# Patient Record
Sex: Male | Born: 1970
Health system: Southern US, Community
[De-identification: ages and names within clinical notes are randomized; demographics above are authoritative.]

## PROBLEM LIST (undated history)

## (undated) DIAGNOSIS — N189 Chronic kidney disease, unspecified: Secondary | ICD-10-CM

## (undated) HISTORY — PX: LITHOTRIPSY: SUR834

---

## 2007-09-15 ENCOUNTER — Emergency Department: Payer: Self-pay | Admitting: Emergency Medicine

## 2012-02-16 ENCOUNTER — Emergency Department: Payer: Self-pay | Admitting: Emergency Medicine

## 2012-02-16 LAB — COMPREHENSIVE METABOLIC PANEL
Alkaline Phosphatase: 31 U/L — ABNORMAL LOW (ref 50–136)
BUN: 11 mg/dL (ref 7–18)
Calcium, Total: 8.3 mg/dL — ABNORMAL LOW (ref 8.5–10.1)
Chloride: 99 mmol/L (ref 98–107)
Co2: 28 mmol/L (ref 21–32)
Creatinine: 1.46 mg/dL — ABNORMAL HIGH (ref 0.60–1.30)
EGFR (Non-African Amer.): 57 — ABNORMAL LOW
Glucose: 127 mg/dL — ABNORMAL HIGH (ref 65–99)
Osmolality: 279 (ref 275–301)
SGOT(AST): 44 U/L — ABNORMAL HIGH (ref 15–37)
SGPT (ALT): 44 U/L
Sodium: 139 mmol/L (ref 136–145)
Total Protein: 7 g/dL (ref 6.4–8.2)

## 2012-02-16 LAB — URINALYSIS, COMPLETE
Bacteria: NONE SEEN
Glucose,UR: NEGATIVE mg/dL (ref 0–75)
Nitrite: NEGATIVE
Protein: NEGATIVE
RBC,UR: 1 /HPF (ref 0–5)
WBC UR: 2 /HPF (ref 0–5)

## 2012-02-16 LAB — CBC WITH DIFFERENTIAL/PLATELET
Basophil %: 0.2 %
HGB: 15.1 g/dL (ref 13.0–18.0)
Lymphocyte #: 0.2 10*3/uL — ABNORMAL LOW (ref 1.0–3.6)
Lymphocyte %: 3.3 %
MCHC: 32.7 g/dL (ref 32.0–36.0)
MCV: 86 fL (ref 80–100)
Monocyte #: 0.7 10*3/uL (ref 0.0–0.7)
Neutrophil %: 87.7 %
RBC: 5.37 10*6/uL (ref 4.40–5.90)

## 2012-02-16 LAB — MONONUCLEOSIS SCREEN: Mono Test: NEGATIVE

## 2012-02-16 LAB — DRUG SCREEN, URINE
Barbiturates, Ur Screen: NEGATIVE (ref ?–200)
Benzodiazepine, Ur Scrn: NEGATIVE (ref ?–200)
Cannabinoid 50 Ng, Ur ~~LOC~~: NEGATIVE (ref ?–50)
Cocaine Metabolite,Ur ~~LOC~~: NEGATIVE (ref ?–300)
Methadone, Ur Screen: NEGATIVE (ref ?–300)
Tricyclic, Ur Screen: NEGATIVE (ref ?–1000)

## 2012-02-21 LAB — CULTURE, BLOOD (SINGLE)

## 2012-12-14 HISTORY — PX: COLONOSCOPY: SHX174

## 2014-06-13 ENCOUNTER — Emergency Department: Payer: Self-pay | Admitting: Emergency Medicine

## 2014-06-13 LAB — CBC WITH DIFFERENTIAL/PLATELET
BASOS ABS: 0.1 10*3/uL (ref 0.0–0.1)
Basophil %: 0.9 %
EOS ABS: 0.1 10*3/uL (ref 0.0–0.7)
EOS PCT: 1.5 %
HCT: 45.6 % (ref 40.0–52.0)
HGB: 14.9 g/dL (ref 13.0–18.0)
LYMPHS ABS: 1.7 10*3/uL (ref 1.0–3.6)
LYMPHS PCT: 21.2 %
MCH: 28.9 pg (ref 26.0–34.0)
MCHC: 32.7 g/dL (ref 32.0–36.0)
MCV: 88 fL (ref 80–100)
MONO ABS: 0.7 x10 3/mm (ref 0.2–1.0)
Monocyte %: 8.4 %
NEUTROS ABS: 5.3 10*3/uL (ref 1.4–6.5)
Neutrophil %: 68 %
PLATELETS: 182 10*3/uL (ref 150–440)
RBC: 5.16 10*6/uL (ref 4.40–5.90)
RDW: 13.3 % (ref 11.5–14.5)
WBC: 7.9 10*3/uL (ref 3.8–10.6)

## 2014-06-13 LAB — LIPASE, BLOOD: Lipase: 120 U/L (ref 73–393)

## 2014-06-13 LAB — URINALYSIS, COMPLETE
Bacteria: NONE SEEN
Bilirubin,UR: NEGATIVE
Glucose,UR: NEGATIVE mg/dL (ref 0–75)
KETONE: NEGATIVE
Leukocyte Esterase: NEGATIVE
NITRITE: NEGATIVE
PH: 7 (ref 4.5–8.0)
PROTEIN: NEGATIVE
RBC,UR: 60 /HPF (ref 0–5)
SQUAMOUS EPITHELIAL: NONE SEEN
Specific Gravity: 1.017 (ref 1.003–1.030)

## 2014-06-13 LAB — COMPREHENSIVE METABOLIC PANEL
ALT: 57 U/L (ref 12–78)
ANION GAP: 6 — AB (ref 7–16)
AST: 86 U/L — AB (ref 15–37)
Albumin: 4 g/dL (ref 3.4–5.0)
Alkaline Phosphatase: 65 U/L
BUN: 11 mg/dL (ref 7–18)
Bilirubin,Total: 0.4 mg/dL (ref 0.2–1.0)
CREATININE: 1.53 mg/dL — AB (ref 0.60–1.30)
Calcium, Total: 8.9 mg/dL (ref 8.5–10.1)
Chloride: 101 mmol/L (ref 98–107)
Co2: 30 mmol/L (ref 21–32)
EGFR (Non-African Amer.): 56 — ABNORMAL LOW
Glucose: 96 mg/dL (ref 65–99)
OSMOLALITY: 273 (ref 275–301)
Potassium: 3.8 mmol/L (ref 3.5–5.1)
SODIUM: 137 mmol/L (ref 136–145)
TOTAL PROTEIN: 7.9 g/dL (ref 6.4–8.2)

## 2014-06-13 LAB — TROPONIN I: Troponin-I: <0.02 ng/mL

## 2014-06-14 ENCOUNTER — Emergency Department: Payer: Self-pay | Admitting: Emergency Medicine

## 2014-06-19 ENCOUNTER — Other Ambulatory Visit: Payer: Self-pay | Admitting: Urology

## 2014-06-25 ENCOUNTER — Encounter (HOSPITAL_COMMUNITY): Payer: Self-pay | Admitting: Pharmacy Technician

## 2014-07-04 NOTE — Progress Notes (Signed)
Patient returned call and stated he passed the stone and was cancelling the litho. Instructed him to call  Dr Herrick's office

## 2014-07-05 ENCOUNTER — Ambulatory Visit (HOSPITAL_COMMUNITY): Admission: RE | Admit: 2014-07-05 | Payer: 59 | Source: Ambulatory Visit | Admitting: Urology

## 2014-07-05 ENCOUNTER — Encounter (HOSPITAL_COMMUNITY): Admission: RE | Payer: Self-pay | Source: Ambulatory Visit

## 2014-07-05 SURGERY — LITHOTRIPSY, ESWL
Anesthesia: LOCAL | Laterality: Right

## 2014-08-07 ENCOUNTER — Encounter (HOSPITAL_COMMUNITY): Payer: Self-pay | Admitting: Pharmacy Technician

## 2014-08-08 ENCOUNTER — Encounter (HOSPITAL_COMMUNITY): Payer: Self-pay | Admitting: *Deleted

## 2014-08-09 ENCOUNTER — Ambulatory Visit (HOSPITAL_COMMUNITY)
Admission: RE | Admit: 2014-08-09 | Discharge: 2014-08-09 | Disposition: A | Discharge status: Home / Self Care | Payer: 59 | Source: Ambulatory Visit | Attending: Urology | Admitting: Urology

## 2014-08-09 ENCOUNTER — Encounter (HOSPITAL_COMMUNITY): Payer: Self-pay | Admitting: *Deleted

## 2014-08-09 ENCOUNTER — Ambulatory Visit (HOSPITAL_COMMUNITY): Payer: 59

## 2014-08-09 ENCOUNTER — Encounter (HOSPITAL_COMMUNITY)
Admission: RE | Disposition: A | Discharge status: Home / Self Care | Payer: Self-pay | Source: Ambulatory Visit | Attending: Urology

## 2014-08-09 DIAGNOSIS — Z79899 Other long term (current) drug therapy: Secondary | ICD-10-CM | POA: Insufficient documentation

## 2014-08-09 DIAGNOSIS — N201 Calculus of ureter: Secondary | ICD-10-CM | POA: Insufficient documentation

## 2014-08-09 DIAGNOSIS — N200 Calculus of kidney: Secondary | ICD-10-CM

## 2014-08-09 HISTORY — DX: Chronic kidney disease, unspecified: N18.9

## 2014-08-09 IMAGING — CR DG ABDOMEN 1V
1 series · 1 of 1 positions shown · non-contrast
Comparison: CT abdomen and pelvis [DATE]

CLINICAL DATA: Preoperative lithotripsy

EXAM:
ABDOMEN - 1 VIEW

[t abdomen supine]
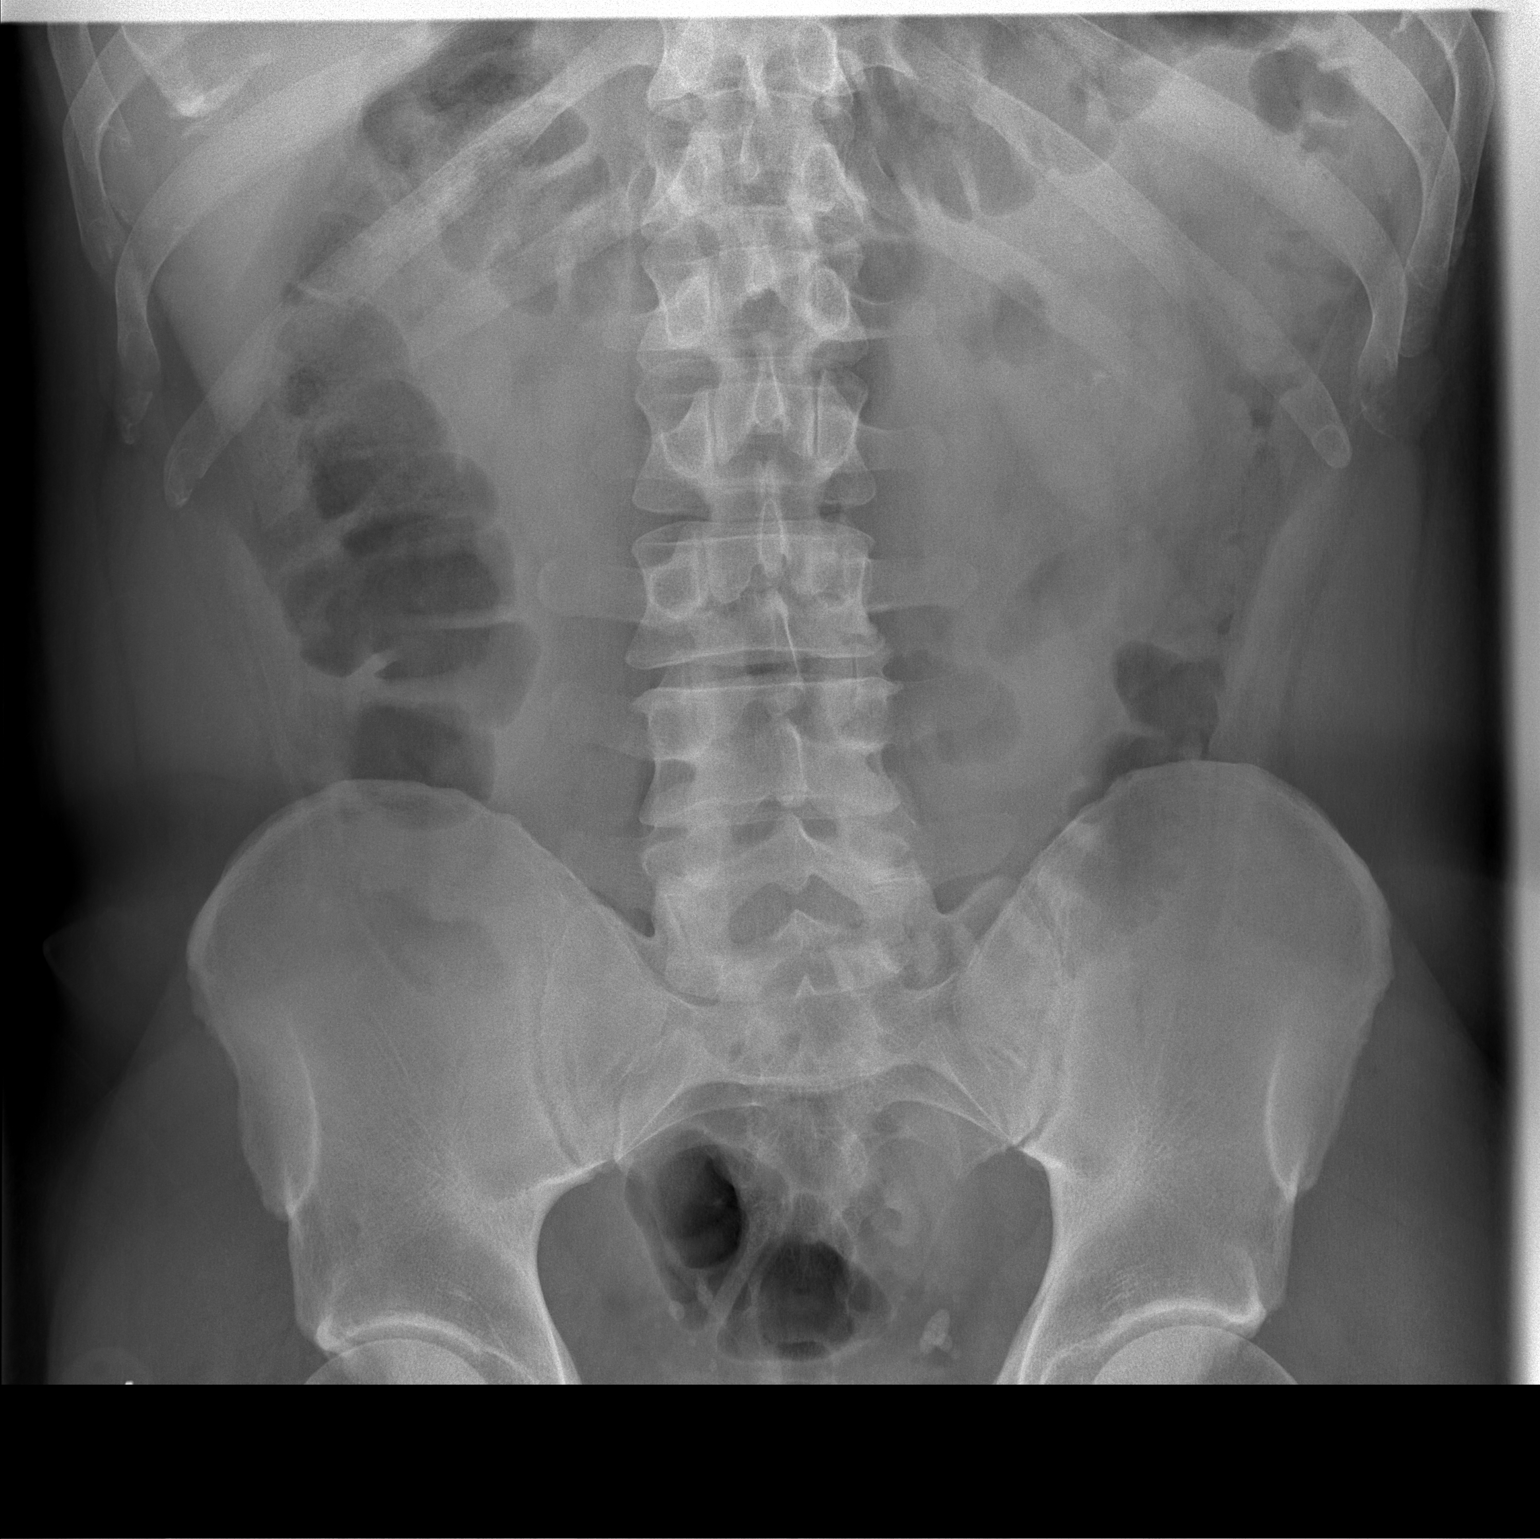

[1 of 1 positions shown; findings below may reference images not displayed]

FINDINGS: There is a 5 mm calcification in the mid right pelvis which
corresponds to a distal ureteral calculus seen on recent CT. There
are nearby phleboliths on the right. There also apparent phleboliths
in the left pelvis. There is a the 4 mm calculus in the lower pole
left kidney as well as a 2 mm calculus in the mid left kidney. Bowel
gas pattern is unremarkable. No obstruction or free air.
IMPRESSION: Small intrarenal calculi on the left. Probable distal ureteral
calculus in the mid right pelvis measuring 5 mm. There are several
phleboliths in the pelvis bilaterally. Bowel gas pattern
unremarkable.

## 2014-08-09 SURGERY — LITHOTRIPSY, ESWL
Anesthesia: LOCAL | Laterality: Right

## 2014-08-09 MED ORDER — DIPHENHYDRAMINE HCL 25 MG PO CAPS
25.0000 mg | ORAL_CAPSULE | ORAL | Status: AC
  Administered 2014-08-09: 25 mg via ORAL
  Filled 2014-08-09: qty 1

## 2014-08-09 MED ORDER — CIPROFLOXACIN HCL 500 MG PO TABS
500.0000 mg | ORAL_TABLET | ORAL | Status: AC
  Administered 2014-08-09: 500 mg via ORAL
  Filled 2014-08-09: qty 1

## 2014-08-09 MED ORDER — SODIUM CHLORIDE 0.9 % IV SOLN
INTRAVENOUS | Status: DC
  Administered 2014-08-09: 15:00:00 via INTRAVENOUS

## 2014-08-09 MED ORDER — DIAZEPAM 5 MG PO TABS
10.0000 mg | ORAL_TABLET | ORAL | Status: AC
  Administered 2014-08-09: 10 mg via ORAL
  Filled 2014-08-09: qty 2

## 2014-08-09 NOTE — Discharge Instructions (Signed)
See [facility] discharge instructions in chart.  

## 2014-08-09 NOTE — Op Note (Signed)
See [facility] OP note scanned into chart. 

## 2014-08-09 NOTE — H&P (Signed)
Reason For Visit Renal colic   History of Present Illness This 43 year old male returns to clinic today after 3 days of intermittent renal colic with associated frequency and urgency. In addition, the patient has been passing blood clots. He initially presented to me possibly one month ago with a 5 x 10 mm mid ureteral stone on the right side. He is scheduled to undergo shockwave lithotripsy, but canceled because he thought that he had passed a stone and was no longer symptomatic. However over the past several days his symptoms have returned. The patient denies any fever/chills. He denies any dysuria.   Interval: Since the patient's last appointment he has had 3 separate episodes of renal colic with associated increased frequency and urgency. The second episode was associated with hematuria. The third episode was associated with extreme flank pain. The patient denies any fevers or chills. He states that he has had intermittent dysuria.   Past Medical History Problems  1. History of Anxiety (300.00)  Surgical History Problems  1. History of No Surgical Problems  Current Meds 1. OxyCODONE HCl - 5 MG Oral Capsule; TAKE 1 TO 2 CAPSULES EVERY 4 HOURS AS  NEEDED FOR BREAKTHROUGH PAIN;  Therapy: 07Jul2015 to (Evaluate:11Aug2015); Last Rx:06Aug2015 Ordered 2. Percocet TABS;  Therapy: (Recorded:07Jul2015) to Recorded 3. Tamsulosin HCl - 0.4 MG Oral Capsule;  Therapy: (Recorded:07Jul2015) to Recorded  Allergies Medication  1. No Known Drug Allergies  Family History Problems  1. Family history of kidney stones (V18.69) : Father  Social History Problems  1. Denied: History of Alcohol use 2. Caffeine use (V49.89) 3. Five children 4. Married 5. Never a smoker 6. Occupation   Production Management  Vitals Vital Signs [Data Includes: Last 1 Day]  Recorded: 24Aug2015 03:28PM  Blood Pressure: 127 / 71 Temperature: 97.7 F Heart Rate: 75  Physical Exam Constitutional: Well nourished  and well developed . No acute distress.  Pulmonary: No respiratory distress and normal respiratory rhythm and effort.  Cardiovascular: Heart rate and rhythm are normal . No peripheral edema.  Neuro/Psych:. Mood and affect are appropriate.    Results/Data Urine [Data Includes: Last 1 Day]   24Aug2015  COLOR YELLOW   APPEARANCE CLEAR   SPECIFIC GRAVITY 1.020   pH 6.0   GLUCOSE NEG mg/dL  BILIRUBIN NEG   KETONE NEG mg/dL  BLOOD MOD   PROTEIN NEG mg/dL  UROBILINOGEN 0.2 mg/dL  NITRITE NEG   LEUKOCYTE ESTERASE NEG   SQUAMOUS EPITHELIAL/HPF NONE SEEN   WBC NONE SEEN WBC/hpf  RBC 3-6 RBC/hpf  BACTERIA NONE SEEN   CRYSTALS NONE SEEN   CASTS NONE SEEN   Selected Results  AU CT-STONE PROTOCOL 24Aug2015 12:00AM Berniece Salines   Test Name Result Flag Reference  CT-STONE PROTOCOL (Report)    ** RADIOLOGY REPORT BY [facility] **   CLINICAL DATA: Micro hematuria.  EXAM: CT ABDOMEN AND PELVIS WITHOUT CONTRAST (URINARY CALCULUS PROTOCOL)  TECHNIQUE: Multidetector CT imaging was performed through the abdomen and pelvis without intravenous contrast to include the urinary tract.  COMPARISON: 06/14/2014  FINDINGS: Lung bases are clear. No effusions. Heart is normal size.  Liver, spleen, pancreas, adrenals have an unremarkable unenhanced appearance. Gallbladder is contracted.  Previously seen right ureter has migrated into the distal right ureter a few cm from the UVJ. This measures 6 mm in largest cross-sectional diameter. Punctate nonobstructing stone in the lower pole of the right kidney. Small nonobstructing stones in the mid to lower poles of the left kidney. Mild right hydronephrosis,  slightly improved since prior study.  Stomach, large and small bowel are unremarkable. Normal appendix. Aorta is normal caliber. No free fluid, free air or adenopathy.  No acute bony abnormality or focal bone lesion.  IMPRESSION: Previously seen proximal right ureteral  stone is now in the distal right ureter a few cm from the right UVJ. This measures 6 mm in cross-sectional diameter.  Bilateral nephrolithiasis.  Mild right hydronephrosis, slightly improved.   Electronically Signed  By: Charlett Nose M.D.  On: 08/06/2014 16:45    The patient's urinalysis today reveals microscopic hematuria without evidence of infection or inflammation. A urine culture has been sent.  Assessment The patient has a persistent right distal ureteral stone with associated proximal hydronephrosis.   Plan Health Maintenance  1. UA With REFLEX; [Do Not Release]; Status:Complete;   Done: 24Aug2015 03:01PM Right ureteral calculus  2. AU CT-STONE PROTOCOL; Status:Complete;   Done: 24Aug2015 12:00AM  Discussion/Summary Patient has been on medical expulsion therapy over the past 5 or 6 weeks. He continues to have intermittent episodes of pain. He is ready to have the stone treated. As such, we discussed treatment options. Predominantly we discussed ureteroscopy and shockwave lithotripsy. He is not interested in ureteroscopy. Given that the stone is visible with a scout film from the CT scan in the right pelvis over the sacral region is reasonable to attempt shockwave lithotripsy. I did counsel him that this may be difficult to see and as a result if I could not targeted safely I would not perform the procedure. The patient has agreed to proceed with the operation.

## 2016-08-06 ENCOUNTER — Encounter
Admission: RE | Admit: 2016-08-06 | Discharge: 2016-08-06 | Disposition: A | Discharge status: Home / Self Care | Payer: 59 | Source: Ambulatory Visit | Attending: Surgery | Admitting: Surgery

## 2016-08-13 NOTE — Patient Instructions (Signed)
  Your procedure is scheduled on: 08-14-16 Report to Same Day Surgery 2nd floor medical mall To find out your arrival time please call [phone] between 1PM - 3PM on 08-13-16  Remember: Instructions that are not followed completely may result in serious medical risk, up to and including death, or upon the discretion of your surgeon and anesthesiologist your surgery may need to be rescheduled.    _x___ 1. Do not eat food or drink liquids after midnight. No gum chewing or hard candies.     __x__ 2. No Alcohol for 24 hours before or after surgery.   __x__3. No Smoking for 24 prior to surgery.   ____  4. Bring all medications with you on the day of surgery if instructed.    __x__ 5. Notify your doctor if there is any change in your medical condition     (cold, fever, infections).     Do not wear jewelry, make-up, hairpins, clips or nail polish.  Do not wear lotions, powders, or perfumes. You may wear deodorant.  Do not shave 48 hours prior to surgery. Men may shave face and neck.  Do not bring valuables to the hospital.    [facility] is not responsible for any belongings or valuables.               Contacts, dentures or bridgework may not be worn into surgery.  Leave your suitcase in the car. After surgery it may be brought to your room.  For patients admitted to the hospital, discharge time is determined by your treatment team.   Patients discharged the day of surgery will not be allowed to drive home.    Please read over the following fact sheets that you were given:     ____ Take these medicines the morning of surgery with A SIP OF WATER:    1. NONE  2.  3.  4.  5.  6.  ____ Fleet Enema (as directed)   ____ Use CHG Soap or sage wipes as directed on instruction sheet   ____ Use inhalers on the day of surgery and bring to hospital day of surgery  ____ Stop metformin 2 days prior to surgery    ____ Take 1/2 of usual insulin dose the night before surgery and none on the  morning of           surgery.   ____ Stop aspirin or coumadin, or plavix  _x__ Stop Anti-inflammatories such as Advil, Aleve, Ibuprofen, Motrin, Naproxen,          Naprosyn, Goodies powders or aspirin products. Ok to take Tylenol.   ____ Stop supplements until after surgery.    ____ Bring C-Pap to the hospital.

## 2016-08-14 ENCOUNTER — Encounter
Admission: RE | Disposition: A | Discharge status: Home / Self Care | Payer: Self-pay | Source: Ambulatory Visit | Attending: Surgery

## 2016-08-14 ENCOUNTER — Ambulatory Visit
Admission: RE | Admit: 2016-08-14 | Discharge: 2016-08-14 | Disposition: A | Discharge status: Home / Self Care | Payer: 59 | Source: Ambulatory Visit | Attending: Surgery | Admitting: Surgery

## 2016-08-14 ENCOUNTER — Ambulatory Visit: Payer: 59 | Admitting: Anesthesiology

## 2016-08-14 DIAGNOSIS — E669 Obesity, unspecified: Secondary | ICD-10-CM | POA: Diagnosis not present

## 2016-08-14 DIAGNOSIS — Z833 Family history of diabetes mellitus: Secondary | ICD-10-CM | POA: Diagnosis not present

## 2016-08-14 DIAGNOSIS — Z8249 Family history of ischemic heart disease and other diseases of the circulatory system: Secondary | ICD-10-CM | POA: Insufficient documentation

## 2016-08-14 DIAGNOSIS — Z79899 Other long term (current) drug therapy: Secondary | ICD-10-CM | POA: Diagnosis not present

## 2016-08-14 DIAGNOSIS — Z87442 Personal history of urinary calculi: Secondary | ICD-10-CM | POA: Insufficient documentation

## 2016-08-14 DIAGNOSIS — Z6834 Body mass index (BMI) 34.0-34.9, adult: Secondary | ICD-10-CM | POA: Insufficient documentation

## 2016-08-14 DIAGNOSIS — K439 Ventral hernia without obstruction or gangrene: Secondary | ICD-10-CM | POA: Diagnosis not present

## 2016-08-14 HISTORY — PX: VENTRAL HERNIA REPAIR: SHX424

## 2016-08-14 SURGERY — REPAIR, HERNIA, VENTRAL
Anesthesia: General

## 2016-08-14 MED ORDER — SODIUM CHLORIDE FLUSH 0.9 % IV SOLN
INTRAVENOUS | Status: AC
  Filled 2016-08-14: qty 10

## 2016-08-14 MED ORDER — FENTANYL CITRATE (PF) 100 MCG/2ML IJ SOLN
INTRAMUSCULAR | Status: AC
  Administered 2016-08-14: 25 ug via INTRAVENOUS
  Filled 2016-08-14: qty 2

## 2016-08-14 MED ORDER — CEFAZOLIN SODIUM-DEXTROSE 2-4 GM/100ML-% IV SOLN
INTRAVENOUS | Status: AC
  Administered 2016-08-14: 2 g via INTRAVENOUS
  Filled 2016-08-14: qty 100

## 2016-08-14 MED ORDER — FAMOTIDINE 20 MG PO TABS
ORAL_TABLET | ORAL | Status: AC
  Administered 2016-08-14: 20 mg via ORAL
  Filled 2016-08-14: qty 1

## 2016-08-14 MED ORDER — FAMOTIDINE 20 MG PO TABS
20.0000 mg | ORAL_TABLET | Freq: Once | ORAL | Status: AC
  Administered 2016-08-14: 20 mg via ORAL

## 2016-08-14 MED ORDER — HYDROCODONE-ACETAMINOPHEN 5-325 MG PO TABS: 1.0000 | ORAL_TABLET | ORAL | 0 refills | Status: DC | PRN

## 2016-08-14 MED ORDER — OXYCODONE HCL 5 MG PO TABS: 5.0000 mg | ORAL_TABLET | Freq: Once | ORAL | Status: DC | PRN

## 2016-08-14 MED ORDER — NEOSTIGMINE METHYLSULFATE 10 MG/10ML IV SOLN
INTRAVENOUS | Status: DC | PRN
  Administered 2016-08-14: 3 mg via INTRAVENOUS

## 2016-08-14 MED ORDER — PROMETHAZINE HCL 25 MG/ML IJ SOLN: 6.2500 mg | INTRAMUSCULAR | Status: DC | PRN

## 2016-08-14 MED ORDER — BUPIVACAINE-EPINEPHRINE (PF) 0.5% -1:200000 IJ SOLN
INTRAMUSCULAR | Status: AC
  Filled 2016-08-14: qty 30

## 2016-08-14 MED ORDER — CEFAZOLIN SODIUM-DEXTROSE 2-4 GM/100ML-% IV SOLN
2.0000 g | Freq: Once | INTRAVENOUS | Status: AC
  Administered 2016-08-14: 2 g via INTRAVENOUS

## 2016-08-14 MED ORDER — MEPERIDINE HCL 25 MG/ML IJ SOLN: 6.2500 mg | INTRAMUSCULAR | Status: DC | PRN

## 2016-08-14 MED ORDER — FENTANYL CITRATE (PF) 100 MCG/2ML IJ SOLN
INTRAMUSCULAR | Status: DC | PRN
  Administered 2016-08-14 (×2): 50 ug via INTRAVENOUS

## 2016-08-14 MED ORDER — PROPOFOL 10 MG/ML IV BOLUS
INTRAVENOUS | Status: DC | PRN
  Administered 2016-08-14: 200 mg via INTRAVENOUS

## 2016-08-14 MED ORDER — OXYCODONE HCL 5 MG/5ML PO SOLN: 5.0000 mg | Freq: Once | ORAL | Status: DC | PRN

## 2016-08-14 MED ORDER — FENTANYL CITRATE (PF) 100 MCG/2ML IJ SOLN
25.0000 ug | INTRAMUSCULAR | Status: DC | PRN
  Administered 2016-08-14 (×4): 25 ug via INTRAVENOUS

## 2016-08-14 MED ORDER — ROCURONIUM BROMIDE 100 MG/10ML IV SOLN
INTRAVENOUS | Status: DC | PRN
  Administered 2016-08-14: 50 mg via INTRAVENOUS

## 2016-08-14 MED ORDER — BUPIVACAINE-EPINEPHRINE 0.5% -1:200000 IJ SOLN
INTRAMUSCULAR | Status: DC | PRN
  Administered 2016-08-14: 10 mL

## 2016-08-14 MED ORDER — LACTATED RINGERS IV SOLN
INTRAVENOUS | Status: DC
  Administered 2016-08-14: 06:00:00 via INTRAVENOUS

## 2016-08-14 MED ORDER — HYDROCODONE-ACETAMINOPHEN 5-325 MG PO TABS: 1.0000 | ORAL_TABLET | ORAL | Status: DC | PRN

## 2016-08-14 MED ORDER — DEXAMETHASONE SODIUM PHOSPHATE 10 MG/ML IJ SOLN
INTRAMUSCULAR | Status: DC | PRN
  Administered 2016-08-14: 10 mg via INTRAVENOUS

## 2016-08-14 MED ORDER — ONDANSETRON HCL 4 MG/2ML IJ SOLN
INTRAMUSCULAR | Status: DC | PRN
  Administered 2016-08-14: 4 mg via INTRAVENOUS

## 2016-08-14 MED ORDER — MIDAZOLAM HCL 2 MG/2ML IJ SOLN
INTRAMUSCULAR | Status: DC | PRN
  Administered 2016-08-14: 2 mg via INTRAVENOUS

## 2016-08-14 MED ORDER — GLYCOPYRROLATE 0.2 MG/ML IJ SOLN
INTRAMUSCULAR | Status: DC | PRN
Start: 2016-08-14 — End: 2016-08-14
  Administered 2016-08-14: 0.4 mg via INTRAVENOUS

## 2016-08-14 MED ORDER — LIDOCAINE HCL (CARDIAC) 20 MG/ML IV SOLN
INTRAVENOUS | Status: DC | PRN
  Administered 2016-08-14: 100 mg via INTRAVENOUS

## 2016-08-14 SURGICAL SUPPLY — 24 items
CANISTER SUCT 1200ML W/VALVE (MISCELLANEOUS) ×3 IMPLANT
CHLORAPREP W/TINT 26ML (MISCELLANEOUS) ×3 IMPLANT
DRAPE LAPAROTOMY 100X77 ABD (DRAPES) ×3 IMPLANT
ELECT REM PT RETURN 9FT ADLT (ELECTROSURGICAL) ×3
ELECTRODE REM PT RTRN 9FT ADLT (ELECTROSURGICAL) ×1 IMPLANT
GAUZE SPONGE 4X4 12PLY STRL (GAUZE/BANDAGES/DRESSINGS) IMPLANT
GLOVE BIO SURGEON STRL SZ7.5 (GLOVE) ×15 IMPLANT
GOWN STRL REUS W/ TWL LRG LVL3 (GOWN DISPOSABLE) ×3 IMPLANT
GOWN STRL REUS W/TWL LRG LVL3 (GOWN DISPOSABLE) ×6
KIT RM TURNOVER STRD PROC AR (KITS) ×3 IMPLANT
LABEL OR SOLS (LABEL) ×3 IMPLANT
LIQUID BAND (GAUZE/BANDAGES/DRESSINGS) ×3 IMPLANT
MESH SYNTHETIC 4X6 SOFT BARD (Mesh General) ×1 IMPLANT
MESH SYNTHETIC SOFT BARD 4X6 (Mesh General) ×2 IMPLANT
NEEDLE HYPO 25X1 1.5 SAFETY (NEEDLE) ×3 IMPLANT
NS IRRIG 500ML POUR BTL (IV SOLUTION) ×3 IMPLANT
PACK BASIN MINOR ARMC (MISCELLANEOUS) ×3 IMPLANT
STAPLER SKIN PROX 35W (STAPLE) IMPLANT
SUT CHROMIC 3 0 SH 27 (SUTURE) ×3 IMPLANT
SUT MNCRL 4-0 (SUTURE) ×2
SUT MNCRL 4-0 27XMFL (SUTURE) ×1
SUT SURGILON 0 30 BLK (SUTURE) ×6 IMPLANT
SUTURE MNCRL 4-0 27XMF (SUTURE) ×1 IMPLANT
SYRINGE 10CC LL (SYRINGE) ×3 IMPLANT

## 2016-08-14 NOTE — H&P (Signed)
  He reports no change in condition since office exam.  On exam demonstrated site of ventral hernia, now reduced.  Labs noted.  Discussed plan for ventral hernia repair

## 2016-08-14 NOTE — Anesthesia Postprocedure Evaluation (Signed)
Anesthesia Post Note  Patient: Nathan Valdez  Procedure(s) Performed: Procedure(s) (LRB): HERNIA REPAIR VENTRAL ADULT WITH MESH (N/A)  Patient location during evaluation: PACU Anesthesia Type: General Level of consciousness: awake and alert and oriented Pain management: pain level controlled Vital Signs Assessment: post-procedure vital signs reviewed and stable Respiratory status: spontaneous breathing, nonlabored ventilation and respiratory function stable Cardiovascular status: blood pressure returned to baseline and stable Postop Assessment: no signs of nausea or vomiting Anesthetic complications: no    Last Vitals:  Vitals:   08/14/16 0925 08/14/16 0933  BP: (!) 105/48 (!) 104/53  Pulse: (!) 59 (!) 56  Resp: 12 12  Temp:      Last Pain:  Vitals:   08/14/16 0933  TempSrc:   PainSc: 1                  Katherin Ramey

## 2016-08-14 NOTE — Progress Notes (Signed)
No complaints of pain    voided

## 2016-08-14 NOTE — Op Note (Signed)
OPERATIVE REPORT  PREOPERATIVE  DIAGNOSIS: . Ventral hernia  POSTOPERATIVE DIAGNOSIS: . Ventral hernia  PROCEDURE: . Ventral hernia repair  ANESTHESIA:  General  SURGEON: Renda RollsWilton Porchia Sinkler  MD   INDICATIONS: . He reports a history of bulging in the epigastrium. A ventral hernia was demonstrated on physical exam and repair was recommended for definitive treatment.  With the patient on the operating table in the supine position under general anesthesia the abdomen was prepared with ChloraPrep and draped in a sterile manner. A longitudinally oriented incision was made approximately 5 cm in length in the epigastrium approximate 7 cm cephalad to the umbilicus. This incision was carried down through subcutaneous tissues. Electrocautery was used for hemostasis. A ventral hernia was demonstrated in that there was herniated properitoneal fat some 4 cm in dimension. The fascial defect was approximately 12 mm. The herniated fat was dissected free from the fascial ring defect. It was necessary to enlarge the incision on the left side by about 5 mm to allow reduction of the hernia. Properitoneal fat was pushed away from the fascia circumferentially. Bard soft mesh was cut to create an oval shape of 2 x 3 cm. This was placed into the properitoneal plane oriented longitudinally and sutured to the overlying fascia with 0 Surgilon. The fascia was closed with a transversely oriented suture line of interrupted 0 Surgilon figure-of-eight sutures incorporating each suture into the mesh. The deep fascia and subcutaneous tissues were infiltrated with half percent Sensorcaine with epinephrine. The superficial fascia was closed with interrupted inverted 3-0 chromic. The skin was closed with running 4-0 Monocryl subcuticular suture and LiquiBand. The patient tolerated surgery satisfactorily and was then prepared for transfer to the recovery room  [facility] Nickole Adamek M.D.

## 2016-08-14 NOTE — Anesthesia Procedure Notes (Signed)
Procedure Name: Intubation Date/Time: 08/14/2016 7:44 AM Performed by: Michaele OfferSAVAGE, Alaysiah Browder Pre-anesthesia Checklist: Patient identified, Emergency Drugs available, Suction available, Patient being monitored and Timeout performed Patient Re-evaluated:Patient Re-evaluated prior to inductionOxygen Delivery Method: Circle system utilized Preoxygenation: Pre-oxygenation with 100% oxygen Intubation Type: IV induction Ventilation: Mask ventilation without difficulty Laryngoscope Size: Mac and 4 Grade View: Grade II Tube type: Oral Tube size: 7.5 mm Number of attempts: 1 Airway Equipment and Method: Rigid stylet Placement Confirmation: ETT inserted through vocal cords under direct vision,  positive ETCO2 and breath sounds checked- equal and bilateral Secured at: 22 cm Tube secured with: Tape Dental Injury: Teeth and Oropharynx as per pre-operative assessment

## 2016-08-14 NOTE — Anesthesia Preprocedure Evaluation (Signed)
Anesthesia Evaluation  Patient identified by MRN, date of birth, ID band Patient awake    Reviewed: Allergy & Precautions, NPO status , Patient's Chart, lab work & pertinent test results  History of Anesthesia Complications Negative for: history of anesthetic complications  Airway Mallampati: III  TM Distance: >3 FB Neck ROM: Full    Dental no notable dental hx.    Pulmonary neg pulmonary ROS, neg sleep apnea, neg COPD,    breath sounds clear to auscultation- rhonchi (-) wheezing      Cardiovascular Exercise Tolerance: Good (-) hypertension(-) CAD and (-) Past MI  Rhythm:Regular Rate:Normal - Systolic murmurs and - Diastolic murmurs    Neuro/Psych negative neurological ROS  negative psych ROS   GI/Hepatic negative GI ROS, Neg liver ROS,   Endo/Other  negative endocrine ROSneg diabetes  Renal/GU CRFRenal disease (baseline Cr 1.5)     Musculoskeletal negative musculoskeletal ROS (+)   Abdominal (+) + obese,   Peds  Hematology negative hematology ROS (+)   Anesthesia Other Findings Past Medical History: No date: Chronic kidney disease     Comment: H/O STONES   Reproductive/Obstetrics                             Anesthesia Physical Anesthesia Plan  ASA: II  Anesthesia Plan: General   Post-op Pain Management:    Induction: Intravenous  Airway Management Planned: Oral ETT  Additional Equipment:   Intra-op Plan:   Post-operative Plan: Extubation in OR  Informed Consent: I have reviewed the patients History and Physical, chart, labs and discussed the procedure including the risks, benefits and alternatives for the proposed anesthesia with the patient or authorized representative who has indicated his/her understanding and acceptance.   Dental advisory given  Plan Discussed with: CRNA and Anesthesiologist  Anesthesia Plan Comments:         Anesthesia Quick Evaluation

## 2016-08-14 NOTE — Discharge Instructions (Addendum)
AMBULATORY SURGERY  DISCHARGE INSTRUCTIONS   1) The drugs that you were given will stay in your system until tomorrow so for the next 24 hours you should not:  A) Drive an automobile B) Make any legal decisions C) Drink any alcoholic beverage   2) You may resume regular meals tomorrow.  Today it is better to start with liquids and gradually work up to solid foods.  You may eat anything you prefer, but it is better to start with liquids, then soup and crackers, and gradually work up to solid foods.   3) Please notify your doctor immediately if you have any unusual bleeding, trouble breathing, redness and pain at the surgery site, drainage, fever, or pain not relieved by medication.    4) Additional Instructions:        Please contact your physician with any problems or Same Day Surgery at [phone], Monday through Friday 6 am to 4 pm, or [place] at [facility] Main number at [phone].AMBULATORY SURGERY  DISCHARGE INSTRUCTIONS   5) The drugs that you were given will stay in your system until tomorrow so for the next 24 hours you should not:  D) Drive an automobile E) Make any legal decisions F) Drink any alcoholic beverage   6) You may resume regular meals tomorrow.  Today it is better to start with liquids and gradually work up to solid foods.  You may eat anything you prefer, but it is better to start with liquids, then soup and crackers, and gradually work up to solid foods.   7) Please notify your doctor immediately if you have any unusual bleeding, trouble breathing, redness and pain at the surgery site, drainage, fever, or pain not relieved by medication.    8) Additional Instructions:        Please contact your physician with any problems or Same Day Surgery at [phone], Monday through Friday 6 am to 4 pm, or [place] at [facility] Main number at [phone].AMBULATORY SURGERY  DISCHARGE INSTRUCTIONS   9) The drugs that you were given  will stay in your system until tomorrow so for the next 24 hours you should not:  G) Drive an automobile H) Make any legal decisions I) Drink any alcoholic beverage   10) You may resume regular meals tomorrow.  Today it is better to start with liquids and gradually work up to solid foods.  You may eat anything you prefer, but it is better to start with liquids, then soup and crackers, and gradually work up to solid foods.   11) Please notify your doctor immediately if you have any unusual bleeding, trouble breathing, redness and pain at the surgery site, drainage, fever, or pain not relieved by medication.    12) Additional Instructions:        Please contact your physician with any problems or Same Day Surgery at [phone], Monday through Friday 6 am to 4 pm, or [place] at [facility] Main number at [phone].Take Tylenol or Norco if needed for pain.  Should not drive when taking Norco.  May shower tomorrow.  Avoid straining and heavy lifting.

## 2016-08-14 NOTE — Transfer of Care (Signed)
Immediate Anesthesia Transfer of Care Note  Patient: Nathan Valdez  Procedure(s) Performed: Procedure(s): HERNIA REPAIR VENTRAL ADULT (N/A)  Patient Location: PACU  Anesthesia Type:General  Level of Consciousness: awake, alert , oriented and patient cooperative  Airway & Oxygen Therapy: Patient Spontanous Breathing and Patient connected to face mask oxygen  Post-op Assessment: Report given to RN, Post -op Vital signs reviewed and stable and Patient moving all extremities X 4  Post vital signs: Reviewed and stable  Last Vitals:  Vitals:   08/14/16 0712 08/14/16 0840  BP:  (!) 113/46  Pulse:  70  Resp:  18  Temp: 36.4 C 36.7 C    Last Pain:  Vitals:   08/14/16 0606  TempSrc: Tympanic         Complications: No apparent anesthesia complications

## 2018-04-21 DIAGNOSIS — J029 Acute pharyngitis, unspecified: Secondary | ICD-10-CM | POA: Diagnosis not present

## 2018-10-10 DIAGNOSIS — B356 Tinea cruris: Secondary | ICD-10-CM | POA: Diagnosis not present

## 2018-10-10 DIAGNOSIS — R7989 Other specified abnormal findings of blood chemistry: Secondary | ICD-10-CM | POA: Diagnosis not present

## 2018-10-24 DIAGNOSIS — R21 Rash and other nonspecific skin eruption: Secondary | ICD-10-CM | POA: Diagnosis not present

## 2018-10-24 DIAGNOSIS — Z6836 Body mass index (BMI) 36.0-36.9, adult: Secondary | ICD-10-CM | POA: Diagnosis not present

## 2018-10-24 DIAGNOSIS — R5381 Other malaise: Secondary | ICD-10-CM | POA: Diagnosis not present

## 2019-12-13 ENCOUNTER — Ambulatory Visit: Payer: 59 | Attending: Internal Medicine

## 2019-12-13 DIAGNOSIS — Z20822 Contact with and (suspected) exposure to covid-19: Secondary | ICD-10-CM

## 2019-12-14 LAB — NOVEL CORONAVIRUS, NAA: SARS-CoV-2, NAA: DETECTED — AB

## 2020-02-14 ENCOUNTER — Other Ambulatory Visit: Payer: Self-pay | Admitting: Student

## 2020-02-14 ENCOUNTER — Ambulatory Visit
Admission: RE | Admit: 2020-02-14 | Discharge: 2020-02-14 | Disposition: A | Discharge status: Home / Self Care | Payer: 59 | Source: Ambulatory Visit | Attending: Student | Admitting: Student

## 2020-02-14 ENCOUNTER — Other Ambulatory Visit: Payer: Self-pay

## 2020-02-14 DIAGNOSIS — R1011 Right upper quadrant pain: Secondary | ICD-10-CM | POA: Diagnosis present

## 2020-03-11 ENCOUNTER — Other Ambulatory Visit: Payer: Self-pay

## 2020-03-11 ENCOUNTER — Ambulatory Visit: Payer: 59 | Attending: Internal Medicine

## 2020-03-11 DIAGNOSIS — Z23 Encounter for immunization: Secondary | ICD-10-CM

## 2020-03-11 NOTE — Progress Notes (Signed)
   Covid-19 Vaccination Clinic  Name:  TARYN NAVE    MRN: [id number] DOB: 06-19-1971  03/11/2020  Mr. Haywood was observed post Covid-19 immunization for 15 minutes without incident. He was provided with Vaccine Information Sheet and instruction to access the V-Safe system.   Mr. Cobern was instructed to call 911 with any severe reactions post vaccine: Marland Kitchen Difficulty breathing  . Swelling of face and throat  . A fast heartbeat  . A bad rash all over body  . Dizziness and weakness   Immunizations Administered    Name Date Dose VIS Date Route   Pfizer COVID-19 Vaccine 03/11/2020  9:28 AM 0.3 mL 11/24/2019 Intramuscular   Manufacturer: [employer], [employer]   Lot: [id number]   NDC: [address]-6

## 2020-04-03 ENCOUNTER — Ambulatory Visit: Payer: 59 | Attending: Internal Medicine

## 2020-04-03 DIAGNOSIS — Z23 Encounter for immunization: Secondary | ICD-10-CM

## 2020-04-03 NOTE — Progress Notes (Signed)
   Covid-19 Vaccination Clinic  Name:  Nathan Valdez    MRN: [id number] DOB: [birth year]  04/03/2020  [name] was observed post Covid-19 immunization for 15 minutes without incident. He was provided with Vaccine Information Sheet and instruction to access the V-Safe system.   [name] was instructed to call 911 with any severe reactions post vaccine: Marland Kitchen Difficulty breathing  . Swelling of face and throat  . A fast heartbeat  . A bad rash all over body  . Dizziness and weakness   Immunizations Administered    Name Date Dose VIS Date Route   Pfizer COVID-19 Vaccine 04/03/2020 11:34 AM 0.3 mL 02/07/2019 Intramuscular   Manufacturer: [employer], [employer]   Lot: [id number]   NDC: [address]-9

## 2020-07-02 ENCOUNTER — Other Ambulatory Visit: Payer: Self-pay | Admitting: Student

## 2020-07-02 ENCOUNTER — Ambulatory Visit
Admission: RE | Admit: 2020-07-02 | Discharge: 2020-07-02 | Disposition: A | Discharge status: Home / Self Care | Payer: 59 | Source: Ambulatory Visit | Attending: Student | Admitting: Student

## 2020-07-02 ENCOUNTER — Other Ambulatory Visit: Payer: Self-pay

## 2020-07-02 DIAGNOSIS — R319 Hematuria, unspecified: Secondary | ICD-10-CM | POA: Diagnosis present

## 2020-07-02 DIAGNOSIS — R109 Unspecified abdominal pain: Secondary | ICD-10-CM | POA: Insufficient documentation

## 2020-07-04 ENCOUNTER — Ambulatory Visit
Admission: RE | Admit: 2020-07-04 | Discharge: 2020-07-04 | Disposition: A | Discharge status: Home / Self Care | Payer: 59 | Source: Ambulatory Visit | Attending: Urology | Admitting: Urology

## 2020-07-04 ENCOUNTER — Encounter: Payer: Self-pay | Admitting: Urology

## 2020-07-04 ENCOUNTER — Encounter
Admission: RE | Disposition: A | Discharge status: Home / Self Care | Payer: Self-pay | Source: Ambulatory Visit | Attending: Urology

## 2020-07-04 ENCOUNTER — Other Ambulatory Visit: Payer: Self-pay

## 2020-07-04 ENCOUNTER — Ambulatory Visit: Payer: 59 | Admitting: Urology

## 2020-07-04 ENCOUNTER — Other Ambulatory Visit: Payer: Self-pay | Admitting: *Deleted

## 2020-07-04 VITALS — BP 132/90 | HR 80 | Ht 72.0 in | Wt 240.0 lb

## 2020-07-04 DIAGNOSIS — N201 Calculus of ureter: Secondary | ICD-10-CM | POA: Diagnosis not present

## 2020-07-04 DIAGNOSIS — N189 Chronic kidney disease, unspecified: Secondary | ICD-10-CM | POA: Diagnosis not present

## 2020-07-04 DIAGNOSIS — N132 Hydronephrosis with renal and ureteral calculous obstruction: Secondary | ICD-10-CM | POA: Diagnosis present

## 2020-07-04 DIAGNOSIS — N200 Calculus of kidney: Secondary | ICD-10-CM | POA: Insufficient documentation

## 2020-07-04 DIAGNOSIS — R310 Gross hematuria: Secondary | ICD-10-CM | POA: Insufficient documentation

## 2020-07-04 DIAGNOSIS — Z87442 Personal history of urinary calculi: Secondary | ICD-10-CM | POA: Insufficient documentation

## 2020-07-04 HISTORY — PX: EXTRACORPOREAL SHOCK WAVE LITHOTRIPSY: SHX1557

## 2020-07-04 SURGERY — LITHOTRIPSY, ESWL
Anesthesia: Choice | Laterality: Right

## 2020-07-04 MED ORDER — ONDANSETRON HCL 4 MG/2ML IJ SOLN: 4.0000 mg | Freq: Once | INTRAMUSCULAR | Status: AC

## 2020-07-04 MED ORDER — DIAZEPAM 5 MG PO TABS
ORAL_TABLET | ORAL | Status: AC
  Administered 2020-07-04: 10 mg via ORAL
  Filled 2020-07-04: qty 2

## 2020-07-04 MED ORDER — CIPROFLOXACIN HCL 500 MG PO TABS
ORAL_TABLET | ORAL | Status: AC
  Administered 2020-07-04: 500 mg via ORAL
  Filled 2020-07-04: qty 1

## 2020-07-04 MED ORDER — DIAZEPAM 5 MG PO TABS: 10.0000 mg | ORAL_TABLET | Freq: Once | ORAL | Status: AC

## 2020-07-04 MED ORDER — DIPHENHYDRAMINE HCL 25 MG PO CAPS: 25.0000 mg | ORAL_CAPSULE | Freq: Once | ORAL | Status: AC

## 2020-07-04 MED ORDER — SODIUM CHLORIDE 0.9 % IV SOLN
INTRAVENOUS | Status: DC
  Administered 2020-07-04: 100 mL/h via INTRAVENOUS

## 2020-07-04 MED ORDER — HYDROCODONE-ACETAMINOPHEN 5-325 MG PO TABS: 1.0000 | ORAL_TABLET | ORAL | 0 refills | Status: AC | PRN

## 2020-07-04 MED ORDER — DIPHENHYDRAMINE HCL 25 MG PO CAPS
ORAL_CAPSULE | ORAL | Status: AC
  Administered 2020-07-04: 25 mg via ORAL
  Filled 2020-07-04: qty 1

## 2020-07-04 MED ORDER — HYDROCODONE-ACETAMINOPHEN 5-325 MG PO TABS
ORAL_TABLET | ORAL | Status: AC
  Filled 2020-07-04: qty 1

## 2020-07-04 MED ORDER — HYDROCODONE-ACETAMINOPHEN 5-325 MG PO TABS
1.0000 | ORAL_TABLET | Freq: Once | ORAL | Status: AC
  Administered 2020-07-04: 1 via ORAL

## 2020-07-04 MED ORDER — TAMSULOSIN HCL 0.4 MG PO CAPS: 0.4000 mg | ORAL_CAPSULE | Freq: Every day | ORAL | 0 refills | Status: AC

## 2020-07-04 MED ORDER — CIPROFLOXACIN HCL 500 MG PO TABS: 500.0000 mg | ORAL_TABLET | Freq: Once | ORAL | Status: AC

## 2020-07-04 MED ORDER — IOHEXOL 300 MG/ML  SOLN
125.0000 mL | Freq: Once | INTRAMUSCULAR | Status: AC | PRN
  Administered 2020-07-04: 125 mL via INTRAVENOUS

## 2020-07-04 MED ORDER — ONDANSETRON HCL 4 MG/2ML IJ SOLN
INTRAMUSCULAR | Status: AC
  Administered 2020-07-04: 4 mg via INTRAVENOUS
  Filled 2020-07-04: qty 2

## 2020-07-04 NOTE — H&P (View-Only) (Signed)
 07/04/20 1:54 PM   Lucifer D Devries 11/07/1971 [id number]  CC: Abdominal pain  HPI: I saw Mr. Nathan Valdez in urology clinic today for abdominal pain.  He has had 2 months of abdominal pain and right-sided flank and groin pain as well as hematuria.  He does have a history of stones previously treated successfully with lithotripsy.  He is leaving town on Monday for 3 weeks.  He denies any fevers or chills.  He has had 20 pounds of weight loss over the last 6 months, but this has been intentional.  Ultrasound done with his PCP on 7/20 showed right-sided hydronephrosis, and a non-obstructing left lower pole stone.  Lab work at that visit was benign with 6-10 RBCs on urinalysis but no evidence of infection, normal renal function, and no leukocytosis.  He is otherwise healthy and does not take any medications.  His last p.o. intake today was soup at noon.   PMH: Past Medical History:  Diagnosis Date  . Chronic kidney disease    H/O STONES    Surgical History: Past Surgical History:  Procedure Laterality Date  . COLONOSCOPY  2014  . LITHOTRIPSY    . VENTRAL HERNIA REPAIR N/A 08/14/2016   Procedure: HERNIA REPAIR VENTRAL ADULT WITH MESH;  Surgeon: Jarvis Wilton Smith, MD;  Location: [facility] ORS;  Service: General;  Laterality: N/A;    Family History: No family history on file.  Social History:  reports that he has never smoked. He has never used smokeless tobacco. He reports that he does not drink alcohol and does not use drugs.  Physical Exam: BP (!) 132/90   Pulse 80   Ht 6' (1.829 m)   Wt (!) 240 lb (108.9 kg)   BMI 32.55 kg/m    Constitutional:  Alert and oriented, No acute distress. Cardiovascular: Regular rate and rhythm Respiratory: Clear to auscultation bilaterally GI: Abdomen is soft, nontender, nondistended, no abdominal masses GU: \Right CVA tenderness  Laboratory Data: Reviewed, see HPI  Pertinent Imaging: I have personally reviewed the CT stone protocol today  showing a 1 cm right mid ureteral stone, 1200HU, 12cm SSD, clearly seen on KUB today.  There is also a 1 cm left lower pole nonobstructing stone.  Assessment & Plan:   49-year-old male with at least 6 weeks of abdominal pain and right-sided flank pain secondary to a 1 cm right mid ureteral stone and ongoing hematuria.  He has no laboratory or clinical signs of infection.  We discussed various treatment options for urolithiasis including observation with or without medical expulsive therapy, shockwave lithotripsy (SWL), ureteroscopy and laser lithotripsy with stent placement, and percutaneous nephrolithotomy.  We discussed that management is based on stone size, location, density, patient co-morbidities, and patient preference.   Stones <5mm in size have a >80% spontaneous passage rate. Data surrounding the use of tamsulosin for medical expulsive therapy is controversial, but meta analyses suggests it is most efficacious for distal stones between 5-10mm in size. Possible side effects include dizziness/lightheadedness, and retrograde ejaculation.  SWL has a lower stone free rate in a single procedure, but also a lower complication rate compared to ureteroscopy and avoids a stent and associated stent related symptoms. Possible complications include renal hematoma, steinstrasse, and need for additional treatment.  Ureteroscopy with laser lithotripsy and stent placement has a higher stone free rate than SWL in a single procedure, however increased complication rate including possible infection, ureteral injury, bleeding, and stent related morbidity. Common stent related symptoms include dysuria, urgency/frequency, and flank   pain.  After an extensive discussion of the risks and benefits of the above treatment options, the patient would like to proceed with right shockwave lithotripsy today.  Legrand Rams, MD 07/04/2020  [facility] [address], [address] [place],  [place] [address] [phone]

## 2020-07-04 NOTE — Brief Op Note (Signed)
07/04/2020  6:53 PM  PATIENT:  Nathan Valdez  49 y.o. male  PRE-OPERATIVE DIAGNOSIS:  1cm right mid ureteral stone  POST-OPERATIVE DIAGNOSIS: Same  PROCEDURE:  Procedure(s): EXTRACORPOREAL SHOCK WAVE LITHOTRIPSY (ESWL) (Right)  SURGEON:  Surgeon(s) and Role:    * Sondra Come, MD - Primary  ANESTHESIA: Conscious Sedation  EBL:  None  Drains: None  Specimen: None  Findings:  1. Good smudging of stone on xray at conclusion of case, tolerated SWL very well  DISPO: Flomax, pain meds PRN, RTC 2 weeks KUB  Legrand Rams, MD 07/04/2020

## 2020-07-04 NOTE — Discharge Instructions (Signed)
AMBULATORY SURGERY  DISCHARGE INSTRUCTIONS   1) The drugs that you were given will stay in your system until tomorrow so for the next 24 hours you should not:  A) Drive an automobile B) Make any legal decisions C) Drink any alcoholic beverage   2) You may resume regular meals tomorrow.  Today it is better to start with liquids and gradually work up to solid foods.  You may eat anything you prefer, but it is better to start with liquids, then soup and crackers, and gradually work up to solid foods.   3) Please notify your doctor immediately if you have any unusual bleeding, trouble breathing, redness and pain at the surgery site, drainage, fever, or pain not relieved by medication.    4) Additional Instructions:        Please contact your physician with any problems or Same Day Surgery at [phone], Monday through Friday 6 am to 4 pm, or [place] at [facility] number at [phone].AMBULATORY SURGERY  DISCHARGE INSTRUCTIONS   5) The drugs that you were given will stay in your system until tomorrow so for the next 24 hours you should not:  D) Drive an automobile E) Make any legal decisions F) Drink any alcoholic beverage   6) You may resume regular meals tomorrow.  Today it is better to start with liquids and gradually work up to solid foods.  You may eat anything you prefer, but it is better to start with liquids, then soup and crackers, and gradually work up to solid foods.   7) Please notify your doctor immediately if you have any unusual bleeding, trouble breathing, redness and pain at the surgery site, drainage, fever, or pain not relieved by medication.    8) Additional Instructions: Follow [facility] Discharge sheet        Please contact your physician with any problems or Same Day Surgery at [phone], Monday through Friday 6 am to 4 pm, or [place] at [facility] number at [phone].

## 2020-07-04 NOTE — Progress Notes (Signed)
07/04/20 1:54 PM   Nathan Valdez [birth year] [id number]  CC: Abdominal pain  HPI: I saw Nathan Valdez in urology clinic today for abdominal pain.  He has had 2 months of abdominal pain and right-sided flank and groin pain as well as hematuria.  He does have a history of stones previously treated successfully with lithotripsy.  He is leaving town on Monday for 3 weeks.  He denies any fevers or chills.  He has had 20 pounds of weight loss over the last 6 months, but this has been intentional.  Ultrasound done with his PCP on 7/20 showed right-sided hydronephrosis, and a non-obstructing left lower pole stone.  Lab work at that visit was benign with 6-10 RBCs on urinalysis but no evidence of infection, normal renal function, and no leukocytosis.  He is otherwise healthy and does not take any medications.  His last p.o. intake today was soup at noon.   PMH: Past Medical History:  Diagnosis Date  . Chronic kidney disease    H/O STONES    Surgical History: Past Surgical History:  Procedure Laterality Date  . COLONOSCOPY  2014  . LITHOTRIPSY    . VENTRAL HERNIA REPAIR N/A 08/14/2016   Procedure: HERNIA REPAIR VENTRAL ADULT WITH MESH;  Surgeon: Nadeen Landau, MD;  Location: [facility] ORS;  Service: General;  Laterality: N/A;    Family History: No family history on file.  Social History:  reports that he has never smoked. He has never used smokeless tobacco. He reports that he does not drink alcohol and does not use drugs.  Physical Exam: BP (!) 132/90   Pulse 80   Ht 6' (1.829 m)   Wt (!) 240 lb (108.9 kg)   BMI 32.55 kg/m    Constitutional:  Alert and oriented, No acute distress. Cardiovascular: Regular rate and rhythm Respiratory: Clear to auscultation bilaterally GI: Abdomen is soft, nontender, nondistended, no abdominal masses GU: \Right CVA tenderness  Laboratory Data: Reviewed, see HPI  Pertinent Imaging: I have personally reviewed the CT stone protocol today  showing a 1 cm right mid ureteral stone, 1200HU, 12cm SSD, clearly seen on KUB today.  There is also a 1 cm left lower pole nonobstructing stone.  Assessment & Plan:   49 year old male with at least 6 weeks of abdominal pain and right-sided flank pain secondary to a 1 cm right mid ureteral stone and ongoing hematuria.  He has no laboratory or clinical signs of infection.  We discussed various treatment options for urolithiasis including observation with or without medical expulsive therapy, shockwave lithotripsy (SWL), ureteroscopy and laser lithotripsy with stent placement, and percutaneous nephrolithotomy.  We discussed that management is based on stone size, location, density, patient co-morbidities, and patient preference.   Stones <29mm in size have a >80% spontaneous passage rate. Data surrounding the use of tamsulosin for medical expulsive therapy is controversial, but meta analyses suggests it is most efficacious for distal stones between 5-71mm in size. Possible side effects include dizziness/lightheadedness, and retrograde ejaculation.  SWL has a lower stone free rate in a single procedure, but also a lower complication rate compared to ureteroscopy and avoids a stent and associated stent related symptoms. Possible complications include renal hematoma, steinstrasse, and need for additional treatment.  Ureteroscopy with laser lithotripsy and stent placement has a higher stone free rate than SWL in a single procedure, however increased complication rate including possible infection, ureteral injury, bleeding, and stent related morbidity. Common stent related symptoms include dysuria, urgency/frequency, and flank  pain.  After an extensive discussion of the risks and benefits of the above treatment options, the patient would like to proceed with right shockwave lithotripsy today.  Legrand Rams, MD 07/04/2020  [facility] [address], [address] [place],  [place] [address] [phone]

## 2020-07-04 NOTE — OR Nursing (Signed)
Dr. Naomie Dean notified of no orders for pt.

## 2020-07-04 NOTE — Patient Instructions (Signed)
Lithotripsy  Lithotripsy is a treatment that can sometimes help eliminate kidney stones and the pain that they cause. A form of lithotripsy, also known as extracorporeal shock wave lithotripsy, is a nonsurgical procedure that crushes a kidney stone with shock waves. These shock waves pass through your body and focus on the kidney stone. They cause the kidney stone to break up while it is still in the urinary tract. This makes it easier for the smaller pieces of stone to pass in the urine. Tell a health care provider about:  Any allergies you have.  All medicines you are taking, including vitamins, herbs, eye drops, creams, and over-the-counter medicines.  Any blood disorders you have.  Any surgeries you have had.  Any medical conditions you have.  Whether you are pregnant or may be pregnant.  Any problems you or family members have had with anesthetic medicines. What are the risks? Generally, this is a safe procedure. However, problems may occur, including:  Infection.  Bleeding of the kidney.  Bruising of the kidney or skin.  Scarring of the kidney, which can lead to: ? Increased blood pressure. ? Poor kidney function. ? Return (recurrence) of kidney stones.  Damage to other structures or organs, such as the liver, colon, spleen, or pancreas.  Blockage (obstruction) of the the tube that carries urine from the kidney to the bladder (ureter).  Failure of the kidney stone to break into pieces (fragments). What happens before the procedure? Staying hydrated Follow instructions from your health care provider about hydration, which may include:  Up to 2 hours before the procedure - you may continue to drink clear liquids, such as water, clear fruit juice, black coffee, and plain tea. Eating and drinking restrictions Follow instructions from your health care provider about eating and drinking, which may include:  8 hours before the procedure - stop eating heavy meals or foods  such as meat, fried foods, or fatty foods.  6 hours before the procedure - stop eating light meals or foods, such as toast or cereal.  6 hours before the procedure - stop drinking milk or drinks that contain milk.  2 hours before the procedure - stop drinking clear liquids. General instructions  Plan to have someone take you home from the hospital or clinic.  Ask your health care provider about: ? Changing or stopping your regular medicines. This is especially important if you are taking diabetes medicines or blood thinners. ? Taking medicines such as aspirin and ibuprofen. These medicines and other NSAIDs can thin your blood. Do not take these medicines for 7 days before your procedure if your health care provider instructs you not to.  You may have tests, such as: ? Blood tests. ? Urine tests. ? Imaging tests, such as a CT scan. What happens during the procedure?  To lower your risk of infection: ? Your health care team will wash or sanitize their hands. ? Your skin will be washed with soap.  An IV tube will be inserted into one of your veins. This tube will give you fluids and medicines.  You will be given one or more of the following: ? A medicine to help you relax (sedative). ? A medicine to make you fall asleep (general anesthetic).  A water-filled cushion may be placed behind your kidney or on your abdomen. In some cases you may be placed in a tub of lukewarm water.  Your body will be positioned in a way that makes it easy to target the kidney   stone.  A flexible tube with holes in it (stent) may be placed in the ureter. This will help keep urine flowing from the kidney if the fragments of the stone have been blocking the ureter.  An X-ray or ultrasound exam will be done to locate your stone.  Shock waves will be aimed at the stone. If you are awake, you may feel a tapping sensation as the shock waves pass through your body. The procedure may vary among health care  providers and hospitals. What happens after the procedure?  You may have an X-ray to see whether the procedure was able to break up the kidney stone and how much of the stone has passed. If large stone fragments remain after treatment, you may need to have a second procedure at a later time.  Your blood pressure, heart rate, breathing rate, and blood oxygen level will be monitored until the medicines you were given have worn off.  You may be given antibiotics or pain medicine as needed.  If a stent was placed in your ureter during surgery, it may stay in place for a few weeks.  You may need strain your urine to collect pieces of the kidney stone for testing.  You will need to drink plenty of water.  Do not drive for 24 hours if you were given a sedative. Summary  Lithotripsy is a treatment that can sometimes help eliminate kidney stones and the pain that they cause.  A form of lithotripsy, also known as extracorporeal shock wave lithotripsy, is a nonsurgical procedure that crushes a kidney stone with shock waves.  Generally, this is a safe procedure. However, problems may occur, including damage to the kidney or other organs, infection, or obstruction of the tube that carries urine from the kidney to the bladder (ureter).  When you go home, you will need to drink plenty of water. You may be asked to strain your urine to collect pieces of the kidney stone for testing. This information is not intended to replace advice given to you by your health care provider. Make sure you discuss any questions you have with your health care provider. Document Revised: 03/13/2019 Document Reviewed: 10/21/2016 Elsevier Patient Education  2020 [employer]vier Inc[employer]  

## 2020-07-04 NOTE — OR Nursing (Signed)
Pt. Released to litho staff without incident. Pt. Awake and alert with VSS and IV infusing.

## 2020-07-05 ENCOUNTER — Encounter: Payer: Self-pay | Admitting: Urology

## 2020-07-15 NOTE — Interval H&P Note (Signed)
History and Physical Interval Note:  07/15/2020 4:38 PM  Nathan Valdez  has presented today for surgery, with the diagnosis of right ureteral stone.  The various methods of treatment have been discussed with the patient and family. After consideration of risks, benefits and other options for treatment, the patient has consented to  Procedure(s): EXTRACORPOREAL SHOCK WAVE LITHOTRIPSY (ESWL) (Right) as a surgical intervention.  The patient's history has been reviewed, patient examined, no change in status, stable for surgery.  I have reviewed the patient's chart and labs.  Questions were answered to the patient's satisfaction.     Sondra Come

## 2020-07-25 ENCOUNTER — Ambulatory Visit: Payer: 59 | Admitting: Physician Assistant

## 2020-07-29 ENCOUNTER — Ambulatory Visit: Payer: 59 | Admitting: Physician Assistant

## 2020-08-07 ENCOUNTER — Ambulatory Visit: Payer: 59 | Admitting: Physician Assistant

## 2020-08-07 ENCOUNTER — Encounter: Payer: Self-pay | Admitting: Physician Assistant

## 2024-04-11 ENCOUNTER — Encounter: Payer: Self-pay | Admitting: Urology

## 2024-04-11 ENCOUNTER — Other Ambulatory Visit: Payer: Self-pay

## 2024-04-11 ENCOUNTER — Other Ambulatory Visit
Admission: RE | Admit: 2024-04-11 | Discharge: 2024-04-11 | Disposition: A | Discharge status: Home / Self Care | Source: Home / Self Care | Attending: Urology | Admitting: Urology

## 2024-04-11 ENCOUNTER — Ambulatory Visit (INDEPENDENT_AMBULATORY_CARE_PROVIDER_SITE_OTHER): Admitting: Urology

## 2024-04-11 ENCOUNTER — Inpatient Hospital Stay: Admit: 2024-04-11

## 2024-04-11 ENCOUNTER — Ambulatory Visit
Admission: RE | Admit: 2024-04-11 | Discharge: 2024-04-11 | Disposition: A | Discharge status: Home / Self Care | Source: Ambulatory Visit | Attending: Urology | Admitting: Urology

## 2024-04-11 VITALS — BP 118/74 | HR 72 | Ht 72.0 in | Wt 248.0 lb

## 2024-04-11 DIAGNOSIS — N201 Calculus of ureter: Secondary | ICD-10-CM

## 2024-04-11 DIAGNOSIS — N200 Calculus of kidney: Secondary | ICD-10-CM | POA: Insufficient documentation

## 2024-04-11 DIAGNOSIS — R1011 Right upper quadrant pain: Secondary | ICD-10-CM

## 2024-04-11 LAB — URINALYSIS, COMPLETE (UACMP) WITH MICROSCOPIC
Bilirubin Urine: NEGATIVE
Glucose, UA: NEGATIVE mg/dL
Ketones, ur: NEGATIVE mg/dL
Leukocytes,Ua: NEGATIVE
Nitrite: NEGATIVE
Protein, ur: 30 mg/dL — AB
RBC / HPF: >50 RBC/hpf (ref 0–5)
Specific Gravity, Urine: 1.02 (ref 1.005–1.030)
pH: 6 (ref 5.0–8.0)

## 2024-04-11 NOTE — Progress Notes (Signed)
   04/11/24 2:10 PM   Nathan Valdez 1971-03-26 [id number]  CC: Possible kidney stone, left flank pain, PSA screening.  HPI: 53 year old male who I saw previously in 2021 for a 1 cm right ureteral stone he ultimately underwent shockwave lithotripsy, he never followed up after that time.  He is referred for recurrent left-sided flank pain and gross hematuria with symptoms similar to prior kidney stone.  His pain is primarily midline low back.  No imaging has been performed.  He denies any urinary symptoms or fevers or chills.  Urinalysis today greater than 50 RBC, 0 WBC, negative leukocytes, few bacteria.  Recent PSA normal 0.58.   PMH: Past Medical History:  Diagnosis Date   Chronic kidney disease    H/O STONES    Surgical History: Past Surgical History:  Procedure Laterality Date   COLONOSCOPY  2014   EXTRACORPOREAL SHOCK WAVE LITHOTRIPSY Right 07/04/2020   Procedure: EXTRACORPOREAL SHOCK WAVE LITHOTRIPSY (ESWL);  Surgeon: Lawerence Pressman, MD;  Location: [facility] ORS;  Service: Urology;  Laterality: Right;   LITHOTRIPSY     VENTRAL HERNIA REPAIR N/A 08/14/2016   Procedure: HERNIA REPAIR VENTRAL ADULT WITH MESH;  Surgeon: Benancio Bracket, MD;  Location: [facility] ORS;  Service: General;  Laterality: N/A;      Family History: No family history on file.  Social History:  reports that he has never smoked. He has never used smokeless tobacco. He reports that he does not drink alcohol and does not use drugs.  Physical Exam: BP 118/74   Pulse 72   Ht 6' (1.829 m)   Wt 248 lb (112.5 kg)   BMI 33.63 kg/m    Constitutional:  Alert and oriented, No acute distress. Cardiovascular: No clubbing, cyanosis, or edema. Respiratory: Normal respiratory effort, no increased work of breathing. GI: Abdomen is soft, nontender, nondistended, no abdominal masses   Laboratory Data: See HPI  Pertinent Imaging: I have personally viewed and interpreted the stat CT scan ordered today showing  1.2 cm left proximal ureteral stone with upstream hydronephrosis, small left lower pole stone, no right hydronephrosis or right-sided stones. 950HU, 12cm SSD.  Assessment & Plan:   53 year old male with midline low back pain and CT showing a 1.2 cm left proximal ureteral stone with upstream hydronephrosis.  We discussed various treatment options for urolithiasis including observation with or without medical expulsive therapy, shockwave lithotripsy (SWL), ureteroscopy and laser lithotripsy with stent placement, and percutaneous nephrolithotomy.We discussed that management is based on stone size, location, density, patient co-morbidities, and patient preference. Stones <57mm in size have a >80% spontaneous passage rate. Data surrounding the use of tamsulosin  for medical expulsive therapy is controversial, but meta analyses suggests it is most efficacious for distal stones between 5-81mm in size. Possible side effects include dizziness/lightheadedness, and retrograde ejaculation.SWL has a lower stone free rate in a single procedure, but also a lower complication rate compared to ureteroscopy and avoids a stent and associated stent related symptoms. Possible complications include renal hematoma, steinstrasse, and need for additional treatment.Ureteroscopy with laser lithotripsy and stent placement has a higher stone free rate than SWL in a single procedure, however increased complication rate including possible infection, ureteral injury, bleeding, and stent related morbidity. Common stent related symptoms include dysuria, urgency/frequency, and flank pain.  Schedule left shockwave lithotripsy  Jay Meth, MD 04/11/2024  [facility] [address] [place], [place] [address] [phone]

## 2024-04-11 NOTE — Progress Notes (Signed)
  ESWL ORDER FORM  Expected date of procedure: 5/1  Surgeon: Dr. Darlynn Elam, MD  Post op standing: 2-4wk follow up w/KUB prior  Anticoagulation/Aspirin/NSAID standing order: Hold all 72 hours prior  Anesthesia standing order: MAC  VTE standing: SCD's  Dx: Left Ureteral Stone  Procedure: left Extracorporeal shock wave lithotripsy  CPT : 50590  Standing Order Set:   *NPO after mn, KUB  *NS 184ml/hr, Keflex 500mg  PO, Benadryl  25mg  PO, Valium  10mg  PO, Zofran  4mg  IV  Medications if other than standing orders:   NONE

## 2024-04-11 NOTE — H&P (View-Only) (Signed)
   04/11/24 2:10 PM   Nathan Valdez 1971-03-26 [id number]  CC: Possible kidney stone, left flank pain, PSA screening.  HPI: 53 year old male who I saw previously in 2021 for a 1 cm right ureteral stone he ultimately underwent shockwave lithotripsy, he never followed up after that time.  He is referred for recurrent left-sided flank pain and gross hematuria with symptoms similar to prior kidney stone.  His pain is primarily midline low back.  No imaging has been performed.  He denies any urinary symptoms or fevers or chills.  Urinalysis today greater than 50 RBC, 0 WBC, negative leukocytes, few bacteria.  Recent PSA normal 0.58.   PMH: Past Medical History:  Diagnosis Date   Chronic kidney disease    H/O STONES    Surgical History: Past Surgical History:  Procedure Laterality Date   COLONOSCOPY  2014   EXTRACORPOREAL SHOCK WAVE LITHOTRIPSY Right 07/04/2020   Procedure: EXTRACORPOREAL SHOCK WAVE LITHOTRIPSY (ESWL);  Surgeon: Lawerence Pressman, MD;  Location: [facility] ORS;  Service: Urology;  Laterality: Right;   LITHOTRIPSY     VENTRAL HERNIA REPAIR N/A 08/14/2016   Procedure: HERNIA REPAIR VENTRAL ADULT WITH MESH;  Surgeon: Benancio Bracket, MD;  Location: [facility] ORS;  Service: General;  Laterality: N/A;      Family History: No family history on file.  Social History:  reports that he has never smoked. He has never used smokeless tobacco. He reports that he does not drink alcohol and does not use drugs.  Physical Exam: BP 118/74   Pulse 72   Ht 6' (1.829 m)   Wt 248 lb (112.5 kg)   BMI 33.63 kg/m    Constitutional:  Alert and oriented, No acute distress. Cardiovascular: No clubbing, cyanosis, or edema. Respiratory: Normal respiratory effort, no increased work of breathing. GI: Abdomen is soft, nontender, nondistended, no abdominal masses   Laboratory Data: See HPI  Pertinent Imaging: I have personally viewed and interpreted the stat CT scan ordered today showing  1.2 cm left proximal ureteral stone with upstream hydronephrosis, small left lower pole stone, no right hydronephrosis or right-sided stones. 950HU, 12cm SSD.  Assessment & Plan:   53 year old male with midline low back pain and CT showing a 1.2 cm left proximal ureteral stone with upstream hydronephrosis.  We discussed various treatment options for urolithiasis including observation with or without medical expulsive therapy, shockwave lithotripsy (SWL), ureteroscopy and laser lithotripsy with stent placement, and percutaneous nephrolithotomy.We discussed that management is based on stone size, location, density, patient co-morbidities, and patient preference. Stones <57mm in size have a >80% spontaneous passage rate. Data surrounding the use of tamsulosin  for medical expulsive therapy is controversial, but meta analyses suggests it is most efficacious for distal stones between 5-81mm in size. Possible side effects include dizziness/lightheadedness, and retrograde ejaculation.SWL has a lower stone free rate in a single procedure, but also a lower complication rate compared to ureteroscopy and avoids a stent and associated stent related symptoms. Possible complications include renal hematoma, steinstrasse, and need for additional treatment.Ureteroscopy with laser lithotripsy and stent placement has a higher stone free rate than SWL in a single procedure, however increased complication rate including possible infection, ureteral injury, bleeding, and stent related morbidity. Common stent related symptoms include dysuria, urgency/frequency, and flank pain.  Schedule left shockwave lithotripsy  Jay Meth, MD 04/11/2024  [facility] [address] [place], [place] [address] [phone]

## 2024-04-11 NOTE — Progress Notes (Signed)
    [facility] ESWL POSTING SHEET        Patient Name: Nathan Valdez  DOB: Oct 12, 1971  MRN: [id number]  Surgeon:  Darlynn Elam, MD  Diagnosis:  Left Ureteral Stone  CPT: 78295  ESWL DATE: 04/13/2024  ESWL TIME: 0730  Special Needs/Requirements: None       Cardiac/Medical/Pulmonary Clearance needed: no       Form Faxed to [facility]- [phone] Date:   Date: 04/11/24       Form Faxed to [place]- [phone]  Date:  Date: 04/11/24           Copy Made for Insurance PA:  Date: 04/11/24       Orders Entered in to Epic:  Date: 04/11/24

## 2024-04-13 ENCOUNTER — Encounter
Admission: RE | Disposition: A | Discharge status: Home / Self Care | Payer: Self-pay | Source: Home / Self Care | Attending: Urology

## 2024-04-13 ENCOUNTER — Encounter: Payer: Self-pay | Admitting: Urology

## 2024-04-13 ENCOUNTER — Ambulatory Visit
Admission: RE | Admit: 2024-04-13 | Discharge: 2024-04-13 | Disposition: A | Discharge status: Home / Self Care | Attending: Urology | Admitting: Urology

## 2024-04-13 ENCOUNTER — Ambulatory Visit

## 2024-04-13 ENCOUNTER — Other Ambulatory Visit: Payer: Self-pay

## 2024-04-13 DIAGNOSIS — N132 Hydronephrosis with renal and ureteral calculous obstruction: Secondary | ICD-10-CM | POA: Diagnosis not present

## 2024-04-13 DIAGNOSIS — Z87442 Personal history of urinary calculi: Secondary | ICD-10-CM | POA: Insufficient documentation

## 2024-04-13 DIAGNOSIS — R310 Gross hematuria: Secondary | ICD-10-CM | POA: Diagnosis not present

## 2024-04-13 DIAGNOSIS — N189 Chronic kidney disease, unspecified: Secondary | ICD-10-CM | POA: Diagnosis not present

## 2024-04-13 DIAGNOSIS — N201 Calculus of ureter: Secondary | ICD-10-CM

## 2024-04-13 HISTORY — PX: EXTRACORPOREAL SHOCK WAVE LITHOTRIPSY: SHX1557

## 2024-04-13 SURGERY — LITHOTRIPSY, ESWL
Anesthesia: Moderate Sedation | Laterality: Left

## 2024-04-13 MED ORDER — DIAZEPAM 5 MG PO TABS
10.0000 mg | ORAL_TABLET | ORAL | Status: AC
Start: 2024-04-13 — End: 2024-04-13
  Administered 2024-04-13: 10 mg via ORAL

## 2024-04-13 MED ORDER — CEPHALEXIN 500 MG PO CAPS
ORAL_CAPSULE | ORAL | Status: AC
  Filled 2024-04-13: qty 1

## 2024-04-13 MED ORDER — DIPHENHYDRAMINE HCL 25 MG PO CAPS
ORAL_CAPSULE | ORAL | Status: AC
  Filled 2024-04-13: qty 1

## 2024-04-13 MED ORDER — CEPHALEXIN 500 MG PO CAPS
500.0000 mg | ORAL_CAPSULE | Freq: Once | ORAL | Status: AC
  Administered 2024-04-13: 500 mg via ORAL

## 2024-04-13 MED ORDER — SODIUM CHLORIDE 0.9 % IV SOLN: INTRAVENOUS | Status: DC

## 2024-04-13 MED ORDER — ONDANSETRON HCL 4 MG/2ML IJ SOLN
INTRAMUSCULAR | Status: AC
  Filled 2024-04-13: qty 2

## 2024-04-13 MED ORDER — DIAZEPAM 5 MG PO TABS
ORAL_TABLET | ORAL | Status: AC
  Filled 2024-04-13: qty 2

## 2024-04-13 MED ORDER — DIPHENHYDRAMINE HCL 25 MG PO CAPS
25.0000 mg | ORAL_CAPSULE | ORAL | Status: AC
  Administered 2024-04-13: 25 mg via ORAL

## 2024-04-13 MED ORDER — ONDANSETRON HCL 4 MG/2ML IJ SOLN
4.0000 mg | Freq: Once | INTRAMUSCULAR | Status: AC
  Administered 2024-04-13: 4 mg via INTRAVENOUS

## 2024-04-13 NOTE — Interval H&P Note (Signed)
 History and Physical Interval Note:  04/13/2024 7:49 AM  Nathan Valdez  has presented today for surgery, with the diagnosis of Left Ureteral Stone.  The various methods of treatment have been discussed with the patient and family. After consideration of risks, benefits and other options for treatment, the patient has consented to  Procedure(s): LITHOTRIPSY, ESWL (Left) as a surgical intervention.  The patient's history has been reviewed, patient examined, no change in status, stable for surgery.  I have reviewed the patient's chart and labs.  Questions were answered to the patient's satisfaction.    CV:RRR Lungs:clear  Geraline Knapp

## 2024-04-13 NOTE — Discharge Instructions (Addendum)
 As per the [facility] discharge instructions Continue pain medication as needed Continue tamsulosin  which will help you pass stone fragments Call [facility] at [phone] for pain not controlled with oral medications or fever greater than 101 degrees You will be contacted for  a follow-up appointment

## 2024-04-14 ENCOUNTER — Encounter: Payer: Self-pay | Admitting: Urology

## 2024-04-17 ENCOUNTER — Other Ambulatory Visit: Payer: Self-pay

## 2024-04-17 DIAGNOSIS — N201 Calculus of ureter: Secondary | ICD-10-CM

## 2024-05-04 ENCOUNTER — Ambulatory Visit: Admitting: Physician Assistant

## 2024-05-05 ENCOUNTER — Ambulatory Visit: Admitting: Physician Assistant
